# Patient Record
Sex: Female | Born: 1975 | Race: Black or African American | Hispanic: No | Marital: Single | Smoking: Current every day smoker
Health system: Southern US, Community
[De-identification: ages and names within clinical notes are randomized; demographics above are authoritative.]

## PROBLEM LIST (undated history)

## (undated) DIAGNOSIS — J45909 Unspecified asthma, uncomplicated: Secondary | ICD-10-CM

## (undated) HISTORY — PX: TUBAL LIGATION: SHX77

---

## 2005-12-25 ENCOUNTER — Emergency Department (HOSPITAL_COMMUNITY): Admission: EM | Admit: 2005-12-25 | Discharge: 2005-12-26 | Payer: Self-pay | Admitting: Emergency Medicine

## 2015-04-08 ENCOUNTER — Emergency Department (HOSPITAL_BASED_OUTPATIENT_CLINIC_OR_DEPARTMENT_OTHER): Payer: Medicaid Other

## 2015-04-08 ENCOUNTER — Encounter (HOSPITAL_BASED_OUTPATIENT_CLINIC_OR_DEPARTMENT_OTHER): Payer: Self-pay

## 2015-04-08 ENCOUNTER — Emergency Department (HOSPITAL_BASED_OUTPATIENT_CLINIC_OR_DEPARTMENT_OTHER)
Admission: EM | Admit: 2015-04-08 | Discharge: 2015-04-08 | Disposition: A | Discharge status: Home / Self Care | Payer: Medicaid Other | Attending: Emergency Medicine | Admitting: Emergency Medicine

## 2015-04-08 DIAGNOSIS — Z91040 Latex allergy status: Secondary | ICD-10-CM | POA: Insufficient documentation

## 2015-04-08 DIAGNOSIS — F17210 Nicotine dependence, cigarettes, uncomplicated: Secondary | ICD-10-CM | POA: Insufficient documentation

## 2015-04-08 DIAGNOSIS — Y9289 Other specified places as the place of occurrence of the external cause: Secondary | ICD-10-CM | POA: Insufficient documentation

## 2015-04-08 DIAGNOSIS — S99911A Unspecified injury of right ankle, initial encounter: Secondary | ICD-10-CM | POA: Diagnosis not present

## 2015-04-08 DIAGNOSIS — Y998 Other external cause status: Secondary | ICD-10-CM | POA: Diagnosis not present

## 2015-04-08 DIAGNOSIS — Y9389 Activity, other specified: Secondary | ICD-10-CM | POA: Diagnosis not present

## 2015-04-08 DIAGNOSIS — W1839XA Other fall on same level, initial encounter: Secondary | ICD-10-CM | POA: Insufficient documentation

## 2015-04-08 DIAGNOSIS — M25571 Pain in right ankle and joints of right foot: Secondary | ICD-10-CM

## 2015-04-08 MED ORDER — IBUPROFEN 600 MG PO TABS
600.0000 mg | ORAL_TABLET | Freq: Four times a day (QID) | ORAL | Status: AC | PRN
Start: 2015-04-08 — End: ?

## 2015-04-08 MED FILL — IBUPROFEN 600 MG TABLET: 600 | 8 days supply | Qty: 30 | Fill #0

## 2015-04-08 NOTE — ED Notes (Signed)
Right ankle injury 2 weeks ago-c/o cont'd pain-NAD-steady gait

## 2015-04-08 NOTE — ED Notes (Signed)
Pt directed to pharmacy to pick up prescriptions. Discharge resource guide given for follow up

## 2015-04-08 NOTE — Discharge Instructions (Signed)
1. Medications: usual home medications 2. Treatment: rest, drink plenty of fluids, ice, elevate, wear ankle brace for comfort 3. Follow Up: please followup with your primary doctor for discussion of your diagnoses and further evaluation after today's visit; if you do not have a primary care doctor use the resource guide provided to find one; please return to the ER for increased pain, swelling, numbness, new or worsening symptoms   Ankle Pain Ankle pain is a common symptom. The bones, cartilage, tendons, and muscles of the ankle joint perform a lot of work each day. The ankle joint holds your body weight and allows you to move around. Ankle pain can occur on either side or back of 1 or both ankles. Ankle pain may be sharp and burning or dull and aching. There may be tenderness, stiffness, redness, or warmth around the ankle. The pain occurs more often when a person walks or puts pressure on the ankle. CAUSES  There are many reasons ankle pain can develop. It is important to work with your caregiver to identify the cause since many conditions can impact the bones, cartilage, muscles, and tendons. Causes for ankle pain include:  Injury, including a break (fracture), sprain, or strain often due to a fall, sports, or a high-impact activity.  Swelling (inflammation) of a tendon (tendonitis).  Achilles tendon rupture.  Ankle instability after repeated sprains and strains.  Poor foot alignment.  Pressure on a nerve (tarsal tunnel syndrome).  Arthritis in the ankle or the lining of the ankle.  Crystal formation in the ankle (gout or pseudogout). DIAGNOSIS  A diagnosis is based on your medical history, your symptoms, results of your physical exam, and results of diagnostic tests. Diagnostic tests may include X-ray exams or a computerized magnetic scan (magnetic resonance imaging, MRI). TREATMENT  Treatment will depend on the cause of your ankle pain and may include:  Keeping pressure off the  ankle and limiting activities.  Using crutches or other walking support (a cane or brace).  Using rest, ice, compression, and elevation.  Participating in physical therapy or home exercises.  Wearing shoe inserts or special shoes.  Losing weight.  Taking medications to reduce pain or swelling or receiving an injection.  Undergoing surgery. HOME CARE INSTRUCTIONS   Only take over-the-counter or prescription medicines for pain, discomfort, or fever as directed by your caregiver.  Put ice on the injured area.  Put ice in a plastic bag.  Place a towel between your skin and the bag.  Leave the ice on for 15-20 minutes at a time, 03-04 times a day.  Keep your leg raised (elevated) when possible to lessen swelling.  Avoid activities that cause ankle pain.  Follow specific exercises as directed by your caregiver.  Record how often you have ankle pain, the location of the pain, and what it feels like. This information may be helpful to you and your caregiver.  Ask your caregiver about returning to work or sports and whether you should drive.  Follow up with your caregiver for further examination, therapy, or testing as directed. SEEK MEDICAL CARE IF:   Pain or swelling continues or worsens beyond 1 week.  You have an oral temperature above 102 F (38.9 C).  You are feeling unwell or have chills.  You are having an increasingly difficult time with walking.  You have loss of sensation or other new symptoms.  You have questions or concerns. MAKE SURE YOU:   Understand these instructions.  Will watch your condition.  Will get help right away if you are not doing well or get worse.   This information is not intended to replace advice given to you by your health care provider. Make sure you discuss any questions you have with your health care provider.   Document Released: 08/09/2009 Document Revised: 05/14/2011 Document Reviewed: 09/21/2014 Elsevier Interactive  Patient Education 2016 [employer].   Emergency Department Resource Guide 1) Find a Doctor and Pay Out of Pocket Although you won't have to find out who is covered by your insurance plan, it is a good idea to ask around and get recommendations. You will then need to call the office and see if the doctor you have chosen will accept you as a new patient and what types of options they offer for patients who are self-pay. Some doctors offer discounts or will set up payment plans for their patients who do not have insurance, but you will need to ask so you aren't surprised when you get to your appointment.  2) Contact Your Local Health Department Not all health departments have doctors that can see patients for sick visits, but many do, so it is worth a call to see if yours does. If you don't know where your local health department is, you can check in your phone book. The CDC also has a tool to help you locate your state's health department, and many state websites also have listings of all of their local health departments.  3) Find a Walk-in Clinic If your illness is not likely to be very severe or complicated, you may want to try a walk in clinic. These are popping up all over the country in pharmacies, drugstores, and shopping centers. They're usually staffed by nurse practitioners or physician assistants that have been trained to treat common illnesses and complaints. They're usually fairly quick and inexpensive. However, if you have serious medical issues or chronic medical problems, these are probably not your best option.  No Primary Care Doctor: - Call Health Connect at  [phone] - they can help you locate a primary care doctor that  accepts your insurance, provides certain services, etc. - Physician Referral Service- [phone]  Chronic Pain Problems: Organization         Address  Phone   Notes  [facility]  [phone] Patients need to be referred by their  primary care doctor.   Medication Assistance: Organization         Address  Phone   Notes  [facility] Medication [facility] [address] [place] [place], [place] [address] [phone] --Must be a resident of [facility] -- Must have NO insurance coverage whatsoever (no Medicaid/ Medicare, etc.) -- The pt. MUST have a primary care doctor that directs their care regularly and follows them in the community   Med[facility]ist  [phone]   [employer]  [phone]    Agencies that provide inexpensive medical care: Organization         Address  Phone   Notes  [facility]  [phone]   [facility]    [phone]   [facility] [address] [place], [place] [address] [phone]   [facility] [address], [place] [phone]   Planned Parenthood    [phone]   [facility]    [phone]   [facility]  [address] [place],  [place] Phone:  [phone], Fax:  [phone] Hours of Operation:  9 am - 6 pm, M-F.  Also accepts Medicaid/Medicare and self-pay.  [facility]  [address], [place] Phone: [phone], Fax: [phone]. Hours of Operation:  8:30 am - 5:30 pm, M-F.  Also accepts Medicaid and self-pay.  [facility] [place] [address] [place] Phone: [phone]   [facility] [address] Natasha Bence [place], [place] [phone], Ext. [phone] Mondays & Thursdays: 7-9 AM.  First 15 patients are seen on a first come, first serve basis.    Medicaid-accepting [facility] Providers:  Organization         Address  Phone   Notes  [facility] [address], [place] [phone] Also accepts self-pay patients.  [facility] [address] Laurell Josephs [place], [place]  [phone]   [facility] [address], [place] [phone]   [facility] [address], [place] [phone]   Renaye Rakers [address], [place]   [phone] Only accepts [place] Access [place] patients after they have their name applied to their card.   Self-Pay (no insurance) in [facility]:  Organization         Address  Phone   Notes  Sickle Cell Patients, [facility] [address] [place], [place] [phone]   [facility] [address] [place], [place] [phone]   [facility] [place]  [address], [place] [phone]   [facility]/Dr. Osei-Bonsu  [address], [place] or [address], [place] [phone] Phone number for both [place] and [place] locations is the same.  [facility] [address] [place] [phone]   [facility] [address] [place] or [address] [phone]   [facility] [address], [place] [phone], phone; [phone], fax Sees patients 1st and 3rd Saturday of every month.  Must not qualify for public or private insurance (i.e. Medicaid, Medicare, [place] Health Choice, Veterans' Benefits)  Household income should be no more than 200% of the poverty level The clinic cannot treat you if you are pregnant or think you are pregnant  Sexually transmitted diseases are not treated at the clinic.    Dental Care: Organization         Address  Phone  Notes  [facility] [address] [place], [place] [phone] Accepts children up to age 36 who are enrolled in [place] or [place] Health Choice; pregnant women with a Medicaid card; and children who have applied for Medicaid or [place] Health Choice, but were declined, whose parents can pay a reduced fee  at time of service.  [facility] Department of [facility]  [address], [place] [phone] Accepts children up to age 34 who are enrolled in [place] or [place] Health Choice; pregnant women with a Medicaid card; and children who have applied for Medicaid or [place] Health Choice, but were declined, whose parents can pay a reduced fee at time of service.  [facility]  [address] [place], [place] [phone] Patients are seen by  appointment only. Walk-ins are not accepted. [facility] will see patients 47 years of age and older. Monday - Tuesday (8am-5pm) Most Wednesdays (8:30-5pm) $30 per visit, cash only  [facility] Adult Dental Access PRO[facility]  [address], [facility] [phone] Patients are seen by appointment only. Walk-ins are not accepted. [facility] will see patients 54 years of age and older. One Wednesday Evening (Monthly: Volunteer Based).  $30 per visit, cash only  [employer]  [phone] for adults; Children under age 28, call Graduate Pediatric Dentistry at [phone]. Children aged 71-14, please call [phone] to request a pediatric application.  Dental services are provided in all areas of dental care including fillings, crowns and bridges, complete and partial dentures, implants, gum treatment, root canals, and extractions. Preventive care is also provided. Treatment is provided to both adults and children. Patients are selected via a lottery and there is often a waiting list.   [facility] [address] [place]  [phone] [email or web address]   [facility] [address] [place], [place] [phone], Ext. [phone] Second and Fourth Thursday of each month, opens at 6:30 AM; Clinic ends at 9 AM.  Patients are seen on a first-come first-served basis, and a limited number are seen during each clinic.   [facility]  [address] Ether Griffins [place], [place] [phone]   Eligibility Requirements You must have lived in [place], [place], or [place] counties for at least the last three months.   You cannot be eligible for state or federal sponsored [place], including [employer], [place], or [employer].   You generally cannot be eligible for healthcare insurance through your employer.    How to apply: Eligibility screenings are held every Tuesday and Wednesday afternoon from 1:00 pm until 4:00 pm. You do not need an appointment for the interview!  [facility] [address] [place], [place] [phone]   [facility] Health Department  [phone]   [facility] Health Department  [phone]   [facility] Health Department  [phone]    Behavioral Health Resources in the Community: Intensive Outpatient Programs Organization         Address  Phone  Notes  [facility] [address] [place], [place] [phone]   [facility] [address] [place], [place] [phone]   [facility] [address], [place], [place]  [phone]   [facility] [address]  [place], [place] [phone] or [phone]   Substance Abuse Resources Organization         Address  Phone  Notes  [facility]  [phone]   [facility]  [phone]   [facility]  [phone]   Floydene Flock  [phone]   Residential & Outpatient Substance Abuse Program  [phone]   Psychological Services Organization         Address  Phone  Notes  [facility]  [phone]   [facility] Services  [phone]   [facility] Mental Health [address] [place] [phone] or [phone]    Mobile Crisis Teams Organization         Address  Phone  Notes  [facility], Mobile Crisis Care Unit  [phone]   [facility] Psychotherapeutic  Services  [address] [place], [place] [phone]   [facility] [address] [place] [phone]  Self-Help/Support Groups Organization         Address  Phone             Notes  [facility] [place] - variety of support groups  [facility] Call for more information  Narcotics Anonymous (NA), [facility] [address], [employer] [place]  2 meetings at this location   Special educational needs teacher         Address  Phone  Notes  [facility] [place],    [place]  [phone]   [facility]  [address] [place] [phone], [place], [place]   [place], [place] [phone] Admissions: 8am-3pm M-F  Incentives Substance [place] [address],    [place], [place] [phone]   [facility] [address] [place], [place], [place]   [facility] [address]  [place], [place]   [facility] - Intensive Outpatient [address], [place], [place]   [facility] [facility] [place]  [place], [place] [phone] or [phone]   [facility] ([facility]) [address], [place], [place] Accepts Medicaid  [facility] [place] [address]  [place] [phone] Substance Abuse/Addiction Treatment   [facility] Organization         Address  Phone  Notes  [facility]  [phone]   Domenic Schwab, PhD [address] Arlis Porta [place], [place]   [phone] or [phone]   [place], [place] [phone]   [facility] [address] [place], [place] [phone] Insurance/Medicaid/sponsorship through [facility] and Families [address]                                    [place], [place] [phone] [place] [address] [place] [address], [place] [phone]    Dr. Adele Schilder  [phone]   [facility] Dept. 1) [address] [place] 2) [place] 3)  [place] [address], [place] [phone]  [phone]   [place] [phone] or [phone] (After Hours)

## 2015-04-08 NOTE — ED Notes (Signed)
Pt reports right ankle pain x 2 weeks since falling from hoverboard.

## 2015-04-08 NOTE — ED Provider Notes (Signed)
CSN: [id number]     Arrival date & time 04/08/15  1149 History   First MD Initiated Contact with Patient 04/08/15 1219     Chief Complaint  Patient presents with  . Ankle Pain    HPI   Kelly Armstrong is a 40 y.o. female with no pertinent PMH who presents to the ED with right ankle pain x 2 weeks. She states she injured her ankle 2 weeks ago while riding a [employer] copy[employer]. She states she fell and twisted her right ankle and landed on her right shoulder. She denies hitting her head or LOC. She reports persistent right ankle pain and associated swelling, though states her right shoulder pain is now resolved. She notes movement exacerbates her pain. She has not tried anything for symptom relief. She denies numbness, weakness, paresthesia.   History reviewed. No pertinent past medical history. Past Surgical History  Procedure Laterality Date  . Tubal ligation     No family history on file. Social History  Substance Use Topics  . Smoking status: Current Every Day Smoker  . Smokeless tobacco: None  . Alcohol Use: Yes     Comment: occ   OB History    No data available       Review of Systems  Musculoskeletal: Positive for joint swelling and arthralgias.  Skin: Negative for color change.  Neurological: Negative for syncope, weakness and numbness.      Allergies  Latex and Shellfish allergy  Home Medications   Prior to Admission medications   Medication Sig Start Date End Date Taking? Authorizing Provider  ibuprofen (ADVIL,MOTRIN) 600 MG tablet Take 1 tablet (600 mg total) by mouth every 6 (six) hours as needed. 04/08/15   Mady Gemma, PA-C    BP 126/88 mmHg  Pulse 72  Temp(Src) 98.6 F (37 C) (Oral)  Resp 16  Ht  (1.626 m)  Wt 73.029 kg  BMI 27.62 kg/m2  SpO2 99%  LMP 03/19/2014 Physical Exam  Constitutional: She is oriented to person, place, and time. She appears well-developed and well-nourished. No distress.  HENT:  Head: Normocephalic and atraumatic.   Right Ear: External ear normal.  Left Ear: External ear normal.  Nose: Nose normal.  Eyes: Conjunctivae and EOM are normal. Right eye exhibits no discharge. Left eye exhibits no discharge. No scleral icterus.  Neck: Normal range of motion. Neck supple.  Cardiovascular: Normal rate, regular rhythm and intact distal pulses.   Pulmonary/Chest: Effort normal and breath sounds normal. No respiratory distress.  Musculoskeletal: Normal range of motion. She exhibits edema and tenderness.  Mild edema of right ankle with TTP over medial malleolus. No erythema or heat. Full ROM. Distal pulses intact. Strength and sensation intact. No TTP to right shoulder. Full ROM. Distal pulses intact. Strength and sensation intact.  Neurological: She is alert and oriented to person, place, and time. She has normal strength. No sensory deficit.  Skin: Skin is warm and dry. She is not diaphoretic.  Psychiatric: She has a normal mood and affect. Her behavior is normal.  Nursing note and vitals reviewed.   ED Course  Procedures (including critical care time)  Labs Review Labs Reviewed - No data to display  Imaging Review Dg Ankle Complete Right  04/08/2015  CLINICAL DATA:  Fall from hoverboard 2 weeks ago with medial ankle pain, initial encounter EXAM: RIGHT ANKLE - COMPLETE 3+ VIEW COMPARISON:  None. FINDINGS: Medial soft tissue swelling is noted. No acute fracture or dislocation is noted. No gross soft  tissue abnormality is noted. An accessory ossicle is noted adjacent to the navicular bone medially. IMPRESSION: No acute fracture is identified. Os naviculare is noted. Electronically Signed   By: Alcide Clever M.D.   On: 04/08/2015 12:18   I have personally reviewed and evaluated these images as part of my medical decision-making.   EKG Interpretation None      MDM   Final diagnoses:  Right ankle pain    40 year old female presents with right ankle pain, which she states started 2 weeks ago. Denies  numbness, weakness, paresthesia. Patient is afebrile. Vital signs stable. On exam, she has mild edema to her right ankle with tenderness to palpation over the medial malleolus, no erythema or heat, full range of motion, patient is neurovascularly intact. Imaging negative for acute fracture. Discussed findings with patient. Advised to rest, ice, and elevate. Will treat with ibuprofen and give ankle brace for comfort. Patient to follow-up with orthopedics for persistent symptoms. Return precautions discussed. Patient verbalizes her understanding and is in agreement with plan.  BP 126/88 mmHg  Pulse 72  Temp(Src) 98.6 F (37 C) (Oral)  Resp 16  Ht  (1.626 m)  Wt 73.029 kg  BMI 27.62 kg/m2  SpO2 99%  LMP 03/19/2014    Mady Gemma, PA-C 04/08/15 1257  Jerelyn Scott, MD 04/08/15 [phone]

## 2015-04-08 NOTE — ED Notes (Signed)
EMT at bedside applying ASO

## 2015-09-01 ENCOUNTER — Encounter (HOSPITAL_BASED_OUTPATIENT_CLINIC_OR_DEPARTMENT_OTHER): Payer: Self-pay | Admitting: *Deleted

## 2015-09-01 ENCOUNTER — Emergency Department (HOSPITAL_BASED_OUTPATIENT_CLINIC_OR_DEPARTMENT_OTHER): Payer: Medicaid Other

## 2015-09-01 ENCOUNTER — Emergency Department (HOSPITAL_BASED_OUTPATIENT_CLINIC_OR_DEPARTMENT_OTHER)
Admission: EM | Admit: 2015-09-01 | Discharge: 2015-09-01 | Disposition: A | Discharge status: Home / Self Care | Payer: Medicaid Other | Attending: Emergency Medicine | Admitting: Emergency Medicine

## 2015-09-01 DIAGNOSIS — F17200 Nicotine dependence, unspecified, uncomplicated: Secondary | ICD-10-CM | POA: Diagnosis not present

## 2015-09-01 DIAGNOSIS — R202 Paresthesia of skin: Secondary | ICD-10-CM | POA: Insufficient documentation

## 2015-09-01 NOTE — ED Notes (Signed)
She fell 6 months ago and had an injury to her left shoulder. She has been having numbness in her arm for unknown amount of time. She feels like her shoulder is dislocated.

## 2015-09-01 NOTE — Discharge Instructions (Signed)
Paresthesia Paresthesia is an abnormal burning or prickling sensation. This sensation is generally felt in the hands, arms, legs, or feet. However, it may occur in any part of the body. Usually, it is not painful. The feeling may be described as:  Tingling or numbness.  Pins and needles.  Skin crawling.  Buzzing.  Limbs falling asleep.  Itching. Most people experience temporary (transient) paresthesia at some time in their lives. Paresthesia may occur when you breathe too quickly (hyperventilation). It can also occur without any apparent cause. Commonly, paresthesia occurs when pressure is placed on a nerve. The sensation quickly goes away after the pressure is removed. For some people, however, paresthesia is a long-lasting (chronic) condition that is caused by an underlying disorder. If you continue to have paresthesia, you may need further medical evaluation. HOME CARE INSTRUCTIONS Watch your condition for any changes. Taking the following actions may help to lessen any discomfort that you are feeling:  Avoid drinking alcohol.  Try acupuncture or massage to help relieve your symptoms.  Keep all follow-up visits as directed by your health care provider. This is important. SEEK MEDICAL CARE IF:  You continue to have episodes of paresthesia.  Your burning or prickling feeling gets worse when you walk.  You have pain, cramps, or dizziness.  You develop a rash. SEEK IMMEDIATE MEDICAL CARE IF:  You feel weak.  You have trouble walking or moving.  You have problems with speech, understanding, or vision.  You feel confused.  You cannot control your bladder or bowel movements.  You have numbness after an injury.  You faint.   This information is not intended to replace advice given to you by your health care provider. Make sure you discuss any questions you have with your health care provider.   Document Released: 02/09/2002 Document Revised: 07/06/2014 Document Reviewed:  02/15/2014 Elsevier Interactive Patient Education 2016 [employer] Thoracic Outlet Syndrome Thoracic outlet syndrome (TOS) is a group of signs and symptoms that result when the vein, artery, or nerves that supply your arm and hand are squeezed (compressed). To reach your arm, all of these have to pass through a tight space under your collarbone and above your top rib (thoracic outlet). There are three types of TOS:  Compression of the nerves that supply your arm and hand is called neurogenic TOS. Most people with TOS have this type.  Compression of the vein that returns blood from your arm and hand (subclavian vein) is called venous TOS.  Compression of the artery that carries blood to your arm and hand (subclavian artery) is called arterial TOS. Arterial TOS is the rarest type. Depending on which structures are affected, you may have symptoms on one side or both sides of your body. CAUSES  Neurogenic TOS may be caused by swelling or scarring in your neck muscles that results in the narrowing of your thoracic outlet. This leads to nerve compression. It can happen from:  Neck injuries from an auto accident (whiplash).  Falls.  Repetitive stress on your neck from working with your arms. This stress could be from using a keyboard all day or working on an assembly line.  Venous TOS may be caused by doing hard work with your arms, especially if you have to lift your arms above your head. A blood clot may form in the vein.  Arterial TOS may be caused by having an extra rib at the base of your neck (cervical rib). This rib presses on your subclavian artery. Over time,  this pressure may cause a clot to form inside the artery, or the artery may weaken and balloon outward (aneurysm). RISK FACTORS  You may be at greater risk for neurogenic TOS after a neck injury or repetitive stress on your neck.  You may be at greater risk for venous TOS if you do strenuous and repetitive work with your  arms.  You may be at greater risk for arterial TOS if you were born with a cervical rib. Risk factors for any type of TOS include:  Being female.  Being overweight.  Having poor posture. SIGNS AND SYMPTOMS  Your signs and symptoms will depend on the type of TOS that you have. Signs and symptoms of neurogenic TOS may include:  Pain in your shoulder, arm, or hand.  Tingling or numbness in your shoulder, arm, or hand.  Tiredness or weakness of your shoulder, arm, or hand.  Neck pain.  Headache. Signs and symptoms of venous TOS may include:  Pain and swelling of your whole arm.  Arm skin that is darker than usual. Signs and symptoms of arterial TOS may include:  Pain and cramps in your arm or hand.  Pale arm skin.  Very cold hands. All signs and symptoms of TOS may be worse when you hold your arms over your head. DIAGNOSIS Your health care provider may suspect TOS from your symptoms. A physical exam will be done. During the exam, your health care provider may ask you to hold your arms over your head to check whether your symptoms get worse. Tests may also be done to confirm the diagnosis and to find out what is causing TOS. These may include:  Imaging studies, such as:  X-rays to look for a cervical rib.  A test using sound waves to create an image (ultrasound).  CT scan.  MRI.  A test that involves measuring and recording the pulses in your wrists (pulse volume recording).  A test that involves measuring the conduction speed of nerve impulses in your arm (nerve conduction velocity test).  A test in which X-rays are done after dye is injected into your subclavian artery or vein (venography or arteriography). TREATMENT  Treatment depends on the type of TOS that you have.  Neurogenic TOS may be treated with:  Physical therapy to learn stretching exercises and good posture.  Occupational therapy to improve your workplace and home environment.  Medicine,  including pain medicine, muscle relaxants, and anti-inflammatory medicine.  Surgery to remove scarred neck muscles or the first rib. This is rarely done for this type of TOS.  Venous TOS may be treated with:  Medicine, including blood thinners or blood clot dissolvers.  Surgery to remove a blood clot.  Surgery to remove the uppermost rib to make more space in the thoracic outlet.  Arterial TOS may be treated with surgery to:  Remove the cervical rib.  Remove a blood clot (thrombus).  Repair an aneurysm. HOME CARE INSTRUCTIONS  Take medicines only as directed by your health care provider.  Maintain a healthy weight. Lose weight as directed by your health care provider.  Do stretching exercises at home as directed by your health care provider or physical therapist.  Maintain good posture.  Do not carry heavy bags over your shoulder.  Do not repetitively lift heavy objects over your head.  Take frequent breaks to stretch and rest your arms if you work at a keyboard or do other repetitive work with your hands and arms.  Keep all follow-up visits  as directed by your health care provider. This is important. SEEK MEDICAL CARE IF:  You have pain, cramps, numbness, or tingling in your arm or hand.  Your arm or hand frequently feels tired.  Your arm develops a darker skin color than usual.  Your hand feels cold.  You have frequent headaches or neckaches. SEEK IMMEDIATE MEDICAL CARE IF:   You lose feeling in your arm or hand.  You are unable to move your fingers.  Your fingers turn a dark color.   This information is not intended to replace advice given to you by your health care provider. Make sure you discuss any questions you have with your health care provider.   Document Released: 02/09/2002 Document Revised: 03/12/2014 Document Reviewed: 07/22/2013 Elsevier Interactive Patient Education [employer] Elsevier Inc[employer]
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
## 2018-11-26 ENCOUNTER — Encounter (HOSPITAL_BASED_OUTPATIENT_CLINIC_OR_DEPARTMENT_OTHER): Payer: Self-pay

## 2018-11-26 ENCOUNTER — Emergency Department (HOSPITAL_BASED_OUTPATIENT_CLINIC_OR_DEPARTMENT_OTHER)
Admission: EM | Admit: 2018-11-26 | Discharge: 2018-11-26 | Disposition: A | Discharge status: Home / Self Care | Payer: Medicaid Other | Attending: Emergency Medicine | Admitting: Emergency Medicine

## 2018-11-26 ENCOUNTER — Other Ambulatory Visit: Payer: Self-pay

## 2018-11-26 DIAGNOSIS — S61212A Laceration without foreign body of right middle finger without damage to nail, initial encounter: Secondary | ICD-10-CM

## 2018-11-26 DIAGNOSIS — Y999 Unspecified external cause status: Secondary | ICD-10-CM | POA: Insufficient documentation

## 2018-11-26 DIAGNOSIS — Y929 Unspecified place or not applicable: Secondary | ICD-10-CM | POA: Diagnosis not present

## 2018-11-26 DIAGNOSIS — Z23 Encounter for immunization: Secondary | ICD-10-CM | POA: Insufficient documentation

## 2018-11-26 DIAGNOSIS — F17200 Nicotine dependence, unspecified, uncomplicated: Secondary | ICD-10-CM | POA: Insufficient documentation

## 2018-11-26 DIAGNOSIS — Y9389 Activity, other specified: Secondary | ICD-10-CM | POA: Insufficient documentation

## 2018-11-26 DIAGNOSIS — W25XXXA Contact with sharp glass, initial encounter: Secondary | ICD-10-CM | POA: Diagnosis not present

## 2018-11-26 MED ORDER — TETANUS-DIPHTH-ACELL PERTUSSIS 5-2.5-18.5 LF-MCG/0.5 IM SUSP
0.5000 mL | Freq: Once | INTRAMUSCULAR | Status: AC
  Administered 2018-11-26: 20:00:00 0.5 mL via INTRAMUSCULAR
  Filled 2018-11-26: qty 0.5

## 2018-11-26 MED ORDER — LIDOCAINE HCL (PF) 1 % IJ SOLN
10.0000 mL | Freq: Once | INTRAMUSCULAR | Status: AC
  Administered 2018-11-26: 10 mL
  Filled 2018-11-26: qty 10

## 2018-11-26 NOTE — ED Provider Notes (Signed)
[facility] EMERGENCY DEPARTMENT Provider Note   CSN: [id number] Arrival date & time: 11/26/18  1847     History   Chief Complaint Chief Complaint  Patient presents with  . Laceration    HPI Tenika Keeran is a 43 y.o. female presenting for evaluation of finger laceration.  Patient states around for quite this afternoon she cut her right middle finger while moving a broken mirror.  She reports pain at the site of the injury.  No pain elsewhere.  She denies numbness or tingling.  She took ibuprofen without improvement of symptoms.  She has no medical problems, takes no medications daily.  She is not on blood thinners.  She does not know when her last tetanus shot was, but thinks it has been more than 10 years.     HPI  History reviewed. No pertinent past medical history.  There are no active problems to display for this patient.   Past Surgical History:  Procedure Laterality Date  . TUBAL LIGATION       OB History   No obstetric history on file.      Home Medications    Prior to Admission medications   Medication Sig Start Date End Date Taking? Authorizing Provider  ibuprofen (ADVIL,MOTRIN) 600 MG tablet Take 1 tablet (600 mg total) by mouth every 6 (six) hours as needed. 04/08/15   Marella Chimes, PA-C    Family History History reviewed. No pertinent family history.  Social History Social History   Tobacco Use  . Smoking status: Current Every Day Smoker  . Smokeless tobacco: Never Used  Substance Use Topics  . Alcohol use: Yes    Comment: occ  . Drug use: No     Allergies   Latex and Shellfish allergy   Review of Systems Review of Systems  Skin: Positive for wound.  Hematological: Does not bruise/bleed easily.     Physical Exam Updated Vital Signs BP (!) 150/85 (BP Location: Right Arm)   Pulse 78   Temp 98.5 F (36.9 C) (Oral)   Resp 16   Ht 5\' 4"  (1.626 m)   Wt 75.8 kg   LMP 11/16/2018   SpO2 100%   BMI 28.67 kg/m    Physical Exam Vitals signs and nursing note reviewed.  Constitutional:      General: She is not in acute distress.    Appearance: She is well-developed.  HENT:     Head: Normocephalic and atraumatic.  Neck:     Musculoskeletal: Normal range of motion.  Pulmonary:     Effort: Pulmonary effort is normal.  Abdominal:     General: There is no distension.  Musculoskeletal: Normal range of motion.     Comments: Full active range of motion of the middle finger without difficulty.  Strength against resistance intact.  Good distal cap refill.  Distal sensation intact.  Skin:    General: Skin is warm.     Capillary Refill: Capillary refill takes less than 2 seconds.     Findings: No rash.     Comments: C-shaped laceration of the radial aspect of the right middle finger between the DIP and PIP.  Minimal bleeding.  Neurological:     Mental Status: She is alert and oriented to person, place, and time.      ED Treatments / Results  Labs (all labs ordered are listed, but only abnormal results are displayed) Labs Reviewed - No data to display  EKG None  Radiology No results found.  Procedures .Marland KitchenLaceration Repair  Date/Time: 11/26/2018 8:40 PM Performed by: Alveria Apley, PA-C Authorized by: Alveria Apley, PA-C   Consent:    Consent obtained:  Verbal   Consent given by:  Patient   Risks discussed:  Infection, need for additional repair, nerve damage, poor wound healing, poor cosmetic result and pain Anesthesia (see MAR for exact dosages):    Anesthesia method:  Nerve block   Block location:  Digital block of the R middle finger   Block needle gauge:  25 G   Block anesthetic:  Lidocaine 1% w/o epi   Block technique:  Tendon sheath   Block injection procedure:  Anatomic landmarks identified, introduced needle, negative aspiration for blood and anatomic landmarks palpated   Block outcome:  Anesthesia achieved Laceration details:    Location:  Finger   Finger location:   R long finger   Wound length (cm): C-shaped lac, 2 cm total.   Depth (mm):  2 Repair type:    Repair type:  Simple Pre-procedure details:    Preparation:  Patient was prepped and draped in usual sterile fashion Exploration:    Hemostasis achieved with:  Tourniquet   Wound exploration: wound explored through full range of motion and entire depth of wound probed and visualized     Wound extent: no foreign bodies/material noted, no muscle damage noted, no nerve damage noted and no tendon damage noted   Treatment:    Area cleansed with:  Betadine   Amount of cleaning:  Standard   Irrigation solution:  Sterile saline   Irrigation method:  Syringe Skin repair:    Repair method:  Sutures   Suture size:  4-0   Suture material:  Prolene   Suture technique:  Simple interrupted   Number of sutures:  3 Approximation:    Approximation:  Close Post-procedure details:    Dressing:  Bulky dressing   Patient tolerance of procedure:  Tolerated well, no immediate complications   (including critical care time)  Medications Ordered in ED Medications  lidocaine (PF) (XYLOCAINE) 1 % injection 10 mL (10 mLs Infiltration Given 11/26/18 2035)  Tdap (BOOSTRIX) injection 0.5 mL (0.5 mLs Intramuscular Given 11/26/18 1940)     Initial Impression / Assessment and Plan / ED Course  I have reviewed the triage vital signs and the nursing notes.  Pertinent labs & imaging results that were available during my care of the patient were reviewed by me and considered in my medical decision making (see chart for details).        Patient presenting for finger laceration.  Physical exam shows patient who is neurovascularly intact.  Laceration repaired as described above.  Aftercare instructions given.  Tetanus updated.  At this time, patient appears safe for discharge.  Return precautions given.  Patient states he understands and agrees to plan.   Final Clinical Impressions(s) / ED Diagnoses   Final diagnoses:   Laceration of right middle finger without foreign body without damage to nail, initial encounter    ED Discharge Orders    None       Drema Balzarine 11/26/18 2042    Loren Racer, MD 11/26/18 2239

## 2018-11-26 NOTE — ED Triage Notes (Signed)
Approx 1600 today pt was moving a broken mirror and sliced right middle finger, bleeding controlled.

## 2018-11-26 NOTE — Discharge Instructions (Signed)
1. Medications: Tylenol or ibuprofen for pain 2. Treatment: ice for swelling, keep wound clean with warm soap and water and keep bandage dry 3. Follow Up: Please return in 10 days to have your stitches removed or sooner if you have concerns.  You may get your sutures removed at her primary office, urgent care, or in the ER.  Return to the emergency department for increased redness, drainage of pus from the wound   WOUND CARE  Keep area clean and dry for 24 hours. Do not remove bandage, if applied.  After 24 hours, remove bandage and wash wound gently with mild soap and warm water. Reapply a new bandage after cleaning wound, if needed.   Continue daily cleansing with soap and water until stitches are removed.  Do not apply any ointments or creams to the wound while stitches are in place, as this may cause delayed healing. Return if you experience any of the following signs of infection: Swelling, redness, pus drainage, streaking, fever >101.0 F  Return if you experience excessive bleeding that does not stop after 15-20 minutes of constant, firm pressure.

## 2018-11-26 NOTE — ED Notes (Signed)
Patient verbalizes understanding of discharge instructions. Opportunity for questioning and answers were provided. Armband removed by staff, pt discharged from ED.  

## 2019-02-26 ENCOUNTER — Encounter (HOSPITAL_BASED_OUTPATIENT_CLINIC_OR_DEPARTMENT_OTHER): Payer: Self-pay | Admitting: Emergency Medicine

## 2019-02-26 ENCOUNTER — Other Ambulatory Visit: Payer: Self-pay

## 2019-02-26 ENCOUNTER — Emergency Department (HOSPITAL_BASED_OUTPATIENT_CLINIC_OR_DEPARTMENT_OTHER): Payer: Medicaid Other

## 2019-02-26 ENCOUNTER — Emergency Department (HOSPITAL_BASED_OUTPATIENT_CLINIC_OR_DEPARTMENT_OTHER)
Admission: EM | Admit: 2019-02-26 | Discharge: 2019-02-26 | Disposition: A | Discharge status: Home / Self Care | Payer: Medicaid Other | Attending: Emergency Medicine | Admitting: Emergency Medicine

## 2019-02-26 DIAGNOSIS — Z91040 Latex allergy status: Secondary | ICD-10-CM | POA: Insufficient documentation

## 2019-02-26 DIAGNOSIS — L089 Local infection of the skin and subcutaneous tissue, unspecified: Secondary | ICD-10-CM | POA: Diagnosis not present

## 2019-02-26 DIAGNOSIS — Y939 Activity, unspecified: Secondary | ICD-10-CM | POA: Insufficient documentation

## 2019-02-26 DIAGNOSIS — S0081XA Abrasion of other part of head, initial encounter: Secondary | ICD-10-CM | POA: Diagnosis present

## 2019-02-26 DIAGNOSIS — F17200 Nicotine dependence, unspecified, uncomplicated: Secondary | ICD-10-CM | POA: Diagnosis not present

## 2019-02-26 DIAGNOSIS — J45909 Unspecified asthma, uncomplicated: Secondary | ICD-10-CM | POA: Diagnosis not present

## 2019-02-26 DIAGNOSIS — Y999 Unspecified external cause status: Secondary | ICD-10-CM | POA: Diagnosis not present

## 2019-02-26 DIAGNOSIS — Y929 Unspecified place or not applicable: Secondary | ICD-10-CM | POA: Insufficient documentation

## 2019-02-26 HISTORY — DX: Unspecified asthma, uncomplicated: J45.909

## 2019-02-26 LAB — CBC WITH DIFFERENTIAL/PLATELET
Abs Immature Granulocytes: 0.01 10*3/uL (ref 0.00–0.07)
Basophils Absolute: 0 10*3/uL (ref 0.0–0.1)
Basophils Relative: 1 %
Eosinophils Absolute: 0.2 10*3/uL (ref 0.0–0.5)
Eosinophils Relative: 3 %
HCT: 38.8 % (ref 36.0–46.0)
Hemoglobin: 12 g/dL (ref 12.0–15.0)
Immature Granulocytes: 0 %
Lymphocytes Relative: 33 %
Lymphs Abs: 2.1 10*3/uL (ref 0.7–4.0)
MCH: 24.2 pg — ABNORMAL LOW (ref 26.0–34.0)
MCHC: 30.9 g/dL (ref 30.0–36.0)
MCV: 78.4 fL — ABNORMAL LOW (ref 80.0–100.0)
Monocytes Absolute: 0.5 10*3/uL (ref 0.1–1.0)
Monocytes Relative: 8 %
Neutro Abs: 3.6 10*3/uL (ref 1.7–7.7)
Neutrophils Relative %: 55 %
Platelets: 345 10*3/uL (ref 150–400)
RBC: 4.95 MIL/uL (ref 3.87–5.11)
RDW: 15 % (ref 11.5–15.5)
WBC: 6.4 10*3/uL (ref 4.0–10.5)
nRBC: 0 % (ref 0.0–0.2)

## 2019-02-26 LAB — BASIC METABOLIC PANEL
Anion gap: 7 (ref 5–15)
BUN: 13 mg/dL (ref 6–20)
CO2: 24 mmol/L (ref 22–32)
Calcium: 8.4 mg/dL — ABNORMAL LOW (ref 8.9–10.3)
Chloride: 107 mmol/L (ref 98–111)
Creatinine, Ser: 0.65 mg/dL (ref 0.44–1.00)
GFR calc Af Amer: >60 mL/min (ref >60)
GFR calc non Af Amer: >60 mL/min (ref >60)
Glucose, Bld: 90 mg/dL (ref 70–99)
Potassium: 3.2 mmol/L — ABNORMAL LOW (ref 3.5–5.1)
Sodium: 138 mmol/L (ref 135–145)

## 2019-02-26 MED ORDER — DOXYCYCLINE HYCLATE 100 MG PO CAPS: 100.0000 mg | ORAL_CAPSULE | Freq: Two times a day (BID) | ORAL | 0 refills | Status: DC

## 2019-02-26 MED ORDER — IOHEXOL 300 MG/ML  SOLN
100.0000 mL | Freq: Once | INTRAMUSCULAR | Status: AC | PRN
  Administered 2019-02-26: 13:00:00 75 mL via INTRAVENOUS

## 2019-02-26 MED ORDER — DOXYCYCLINE HYCLATE 100 MG PO CAPS: 100.0000 mg | ORAL_CAPSULE | Freq: Two times a day (BID) | ORAL | 0 refills | Status: AC

## 2019-02-26 MED ORDER — SODIUM CHLORIDE 0.9 % IV SOLN
1.0000 g | Freq: Once | INTRAVENOUS | Status: AC
  Administered 2019-02-26: 12:00:00 1 g via INTRAVENOUS
  Filled 2019-02-26: qty 10

## 2019-02-26 MED FILL — DOXYCYCLINE HYCLATE 100 MG: 100 | 7 days supply | Qty: 14 | Fill #0
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
